# Patient Record
Sex: Male | Born: 1995
Health system: Southern US, Community
[De-identification: ages and names within clinical notes are randomized; demographics above are authoritative.]

## PROBLEM LIST (undated history)

## (undated) DIAGNOSIS — G252 Other specified forms of tremor: Secondary | ICD-10-CM

## (undated) DIAGNOSIS — M25569 Pain in unspecified knee: Secondary | ICD-10-CM

## (undated) DIAGNOSIS — R1013 Epigastric pain: Secondary | ICD-10-CM

## (undated) DIAGNOSIS — H9193 Unspecified hearing loss, bilateral: Secondary | ICD-10-CM

## (undated) HISTORY — PX: TESTICLE SURGERY: SHX794

## (undated) HISTORY — DX: Other specified forms of tremor: G25.2

## (undated) HISTORY — DX: Unspecified hearing loss, bilateral: H91.93

## (undated) HISTORY — DX: Epigastric pain: R10.13

## (undated) HISTORY — DX: Pain in unspecified knee: M25.569

---

## 2018-04-15 DIAGNOSIS — F321 Major depressive disorder, single episode, moderate: Secondary | ICD-10-CM | POA: Diagnosis not present

## 2018-04-22 DIAGNOSIS — F321 Major depressive disorder, single episode, moderate: Secondary | ICD-10-CM | POA: Diagnosis not present

## 2018-04-29 DIAGNOSIS — F321 Major depressive disorder, single episode, moderate: Secondary | ICD-10-CM | POA: Diagnosis not present

## 2018-05-20 DIAGNOSIS — F321 Major depressive disorder, single episode, moderate: Secondary | ICD-10-CM | POA: Diagnosis not present

## 2018-06-10 DIAGNOSIS — Z0000 Encounter for general adult medical examination without abnormal findings: Secondary | ICD-10-CM | POA: Diagnosis not present

## 2018-07-06 DIAGNOSIS — H9193 Unspecified hearing loss, bilateral: Secondary | ICD-10-CM | POA: Diagnosis not present

## 2018-07-06 DIAGNOSIS — G252 Other specified forms of tremor: Secondary | ICD-10-CM | POA: Diagnosis not present

## 2018-07-06 DIAGNOSIS — M25561 Pain in right knee: Secondary | ICD-10-CM | POA: Diagnosis not present

## 2018-07-06 DIAGNOSIS — R1013 Epigastric pain: Secondary | ICD-10-CM | POA: Diagnosis not present

## 2018-07-22 DIAGNOSIS — F321 Major depressive disorder, single episode, moderate: Secondary | ICD-10-CM | POA: Diagnosis not present

## 2018-08-19 DIAGNOSIS — F321 Major depressive disorder, single episode, moderate: Secondary | ICD-10-CM | POA: Diagnosis not present

## 2018-08-31 ENCOUNTER — Encounter: Payer: Self-pay | Admitting: Neurology

## 2018-08-31 ENCOUNTER — Ambulatory Visit (INDEPENDENT_AMBULATORY_CARE_PROVIDER_SITE_OTHER): Payer: Medicare Other | Admitting: Neurology

## 2018-08-31 VITALS — BP 139/89 | HR 84 | Ht 74.0 in | Wt 167.0 lb

## 2018-08-31 DIAGNOSIS — R251 Tremor, unspecified: Secondary | ICD-10-CM

## 2018-08-31 DIAGNOSIS — Z820 Family history of epilepsy and other diseases of the nervous system: Secondary | ICD-10-CM

## 2018-08-31 NOTE — Progress Notes (Signed)
Subjective:    Patient ID: Joel Duncan is a 22 y.o. male.  HPI     Huston Foley, MD, PhD [facility] [address] [address] [place], [place] [address]  Dear Dr. Nedra Hai,   I saw your patient, Joel Duncan, upon your kind request in my neurologic clinic today for initial consultation of his tremor. The patient is accompanied by his father today. As you know, Joel Duncan is a 22 year old right-handed Hispanic gentleman (originally from [place]), with an underlying medical history of hearing loss, right knee pain, and otherwise benign medical history, who reports a long-standing history of hand tremors. He had hand tremors since he was in kindergarten according to the father. He has had hearing loss since childhood, received hearing aids in sixth grade. He has a maternal family history of hearing loss. He has a family history of hand tremors affecting his father, his paternal great uncle, 2 paternal uncles and paternal grandmother. Other than his grandmother, all affected family members have been males and primarily affects the right hand. His tremor versus started in the right hand and in the past years he has noticed it on the left hand. I reviewed your office note from 07/06/2018, as well as 12/30/2016. He has previously seen a neurologist at [facility] and also at [facility] in the past. Prior neurology records were reviewed in Epic.  He had Brain MRI studies in the past.  He had a brain MRI wo contrast at [facility], on 11/04/11, which I reviewed: 1. No findings to suggest the etiology of patient's tremor. There are scattered, punctate T2 hyperintense white matter lesions which are abnormal but nonspecific. These may represent sequela of prior trauma, inflammation, demyelination, or other  vasculopathies but can also be incidental. 2. Posterior fossa arachnoid cyst. He also had prior MRI brain w wo contrast at [facility] in 04/30/06 which I reviewed: IMPRESSION: Posterior fossa  arachnoid cyst, otherwise unremarkable MRI of the brain using the cranial nerve VIII protocol. He has been tried on Mysoline in the past, but he did not help. Propranolol caused worsening tremor he felt. Both meds were sedating.  He had genetic testing in the past including SCA 3 which was negative, fragile X testing was considered but was never done. He does not have a history of developmental delays according to the father. He did require speech therapy because of his hearing loss. He went through [employer] for 1 year but gave up after he felt it was too much stress. He currently works at [employer] and is hoping to become a Production designer, theatre/television/film. DBS was discussed when he was at [facility] several years ago but he was too young for it. He has a sister with no tremor. Father has sisters with no tremors. He had recent blood work through your office on 06/10/2018. I reviewed the results. Total cholesterol was 204, LDL 123. CBC with differential was unremarkable, CMP unremarkable, BUN 11, creatinine 0.84.  His Past Medical History Is Significant For: Past Medical History:  Diagnosis Date  . Bilateral hearing loss   . Dyspepsia   . Intention tremor   . Knee pain    His Past Surgical History Is Significant For:  His Family History Is Significant For: Family History  Problem Relation Age of Onset  . Arthritis Father   . Hypertension Maternal Grandmother   . Hypertension Paternal Grandmother     His Social History Is Significant For: Social History   Socioeconomic History  .  Marital status: Single    Spouse name: Not on file  . Number of children: Not on file  . Years of education: Not on file  . Highest education level: Not on file  Occupational History  . Not on file  Social Needs  . Financial resource strain: Not on file  . Food insecurity:    Worry: Not on file    Inability: Not on file  . Transportation needs:    Medical: Not on file    Non-medical: Not on file   Tobacco Use  . Smoking status: Never Smoker  . Smokeless tobacco: Never Used  Substance and Sexual Activity  . Alcohol use: Never    Frequency: Never  . Drug use: Never  . Sexual activity: Not on file  Lifestyle  . Physical activity:    Days per week: Not on file    Minutes per session: Not on file  . Stress: Not on file  Relationships  . Social connections:    Talks on phone: Not on file    Gets together: Not on file    Attends religious service: Not on file    Active member of club or organization: Not on file    Attends meetings of clubs or organizations: Not on file    Relationship status: Not on file  Other Topics Concern  . Not on file  Social History Narrative  . Not on file    His Allergies Are:  No Known Allergies:   His Current Medications Are:  No outpatient encounter medications on file as of 08/31/2018.   No facility-administered encounter medications on file as of 08/31/2018.   : Review of Systems:  Out of a complete 14 point review of systems, all are reviewed and negative with the exception of these symptoms as listed below:  Review of Systems  Neurological:       Pt presents today to discuss his tremor. Pt is right handed and notices the tremor primarily in his right hand but sometimes in his left hand. Pt has tried propranolol in the past but it did not help.    Objective:  Neurological Exam  Physical Exam Physical Examination:   Vitals:   08/31/18 0956  BP: 139/89  Pulse: 84   General Examination: The patient is a very pleasant 22 y.o. male in no acute distress. He appears well-developed and well-nourished and well groomed.   HEENT: Normocephalic, atraumatic, pupils are equal, round and reactive to light and accommodation. Narrow set eyes, extraocular tracking is good without limitation to gaze excursion or nystagmus noted. Normal smooth pursuit is noted. Hearing is mildly impaired, has b/l Hearing aids. Face is symmetric with normal facial  animation and normal facial sensation. Speech is clear for the most, part, mild speech impediment. There is no hypophonia. There is no lip, neck/head, jaw or voice tremor. Neck is supple with full range of passive and active motion. There are no carotid bruits on auscultation. Oropharynx exam reveals: mild mouth dryness, adequate teeth hygiene. Tongue protrudes centrally and palate elevates symmetrically.    Chest: Clear to auscultation without wheezing, rhonchi or crackles noted.  Heart: S1+S2+0, regular and normal without murmurs, rubs or gallops noted.   Abdomen: Soft, non-tender and non-distended with normal bowel sounds appreciated on auscultation.  Extremities: There is no pitting edema in the distal lower extremities bilaterally. Pedal pulses are intact.  Skin: Warm and dry without trophic changes noted.  Musculoskeletal: exam reveals no obvious joint deformities, tenderness or joint  swelling or erythema. Long appearing arms.   Neurologically:  Mental status: The patient is awake, alert and oriented in all 4 spheres. His immediate and remote memory, attention, language skills and fund of knowledge are appropriate. There is no evidence of aphasia, agnosia, apraxia or anomia. Speech is clear with normal prosody and enunciation. Thought process is linear. Mood is normal and affect is normal.  Cranial nerves II - XII are as described above under HEENT exam. In addition: shoulder shrug is normal with equal shoulder height noted. Motor exam: Normal bulk, strength and tone is noted. There is no drift, or rebound. Romberg is negative. Reflexes are 2+ throughout. Fine motor skills and coordination: intact with normal finger taps, normal hand movements, normal rapid alternating patting, normal foot taps and normal foot agility.  Cerebellar testing: No dysmetria or intention tremor on finger to nose testing. Heel to shin is unremarkable bilaterally. There is no truncal or gait ataxia.   On  08/31/2018: on Archimedes spiral drawing he has a coarse and moderate degree of tremor with the right in the left hand. His handwriting is tremulous, legible, not micrographic.  He has a moderate b/l UE postural and action tremor, no significant intention tremor. He does have mild and intermittent resting tremor component. Sensory exam: intact to light touch, pinprick, vibration, temperature senseproprioception in the upper and lower extremities.  Gait, station and balance: He stands easily. No veering to one side is noted. No leaning to one side is noted. Posture is age-appropriate and stance is narrow based. Gait shows normal stride length and normal pace, but decrease in arm swing b/l.                Assessment and Plan:   In summary, Joel Duncan is a very pleasant 22 y.o.-year old male with an underlying medical history of hearing loss, right knee pain, and otherwise benign medical history, who presents for evaluation of his Long-standing history of hand tremors with a positive family history on his father's side of right-sided hand tremor primarily. On examination, he does have a bilateral postural and action tremorin the moderate degree with a slight resting component. His history and examination is unusual in that he is rather young. I'm not sure that this is classic essential tremor but for now, he does not have any other diagnosis. He does not have any telltale dystonia or parkinsonism. Nevertheless, he does have reduced arm swing bilaterally. He may be a candidate for DBS, we talked about this to some degree today. Unfortunately, he did not respond to typical symptomatic treatment in the form of beta blocker or Mysoline. Again, this makes essential tremor rather questionable in my mind. I would like to proceed with a DaT scan, which may help diagnostically. I explained the scan to him and his father in detail today. Written informed consent was obtained today. I wonder if he would respond to  low-dose Sinemet. I would like to proceed with the scan first however. We may consider referral to [facility] for DBS evaluation. He is reluctant, which I completely understand as he is so young and father's also worried about the risks associated with DBS. Nevertheless, from the medication standpoint I really don't have any other suggestions quite yet. I would like to see him back soon after his nuclear medicine scan. He does note exacerbation with nervousness and anxiety. We talked about tremor triggers as well today.  I answered all their questions today and the patient and his father  were in agreement with the above outlined plan. Most of my 60 minute visit today was spent in counseling and coordination of care, reviewing test results and reviewing medication.  Thank you very much for allowing me to participate in the care of this nice patient. If I can be of any further assistance to you please do not hesitate to call me at [phone].  Sincerely,   Huston Foley, MD, PhD

## 2018-08-31 NOTE — Patient Instructions (Addendum)
You may have a form of essential tremor, you do have a family history of it in your father and father's side of the family.   You have had brain MRI testing before, which did not show any significant abnormality.  As discussed, we will do a DaT scan: This is a specialized brain scan designed to help with diagnosis of tremor disorders. A radioactive marker gets injected and the uptake is measured in the brain and compared to normal controls and right side is compared to the left, a change in uptake can help with diagnosis of certain tremor disorders. A brain MRI on the other hand is a brain scan that helps look at the brain structure in more detail overall and look for age-related changes, blood vessel related changes and look for stroke and volume loss which we call atrophy.   Also, you may be a candidate for DBS: I may make a referral to Dr. Rubin Payor at [facility] for evaluation for deep brain stimulation (DBS) for treatment of advanced essential tremor.  We will see you back in 3 months.

## 2018-09-30 DIAGNOSIS — F321 Major depressive disorder, single episode, moderate: Secondary | ICD-10-CM | POA: Diagnosis not present

## 2018-10-08 ENCOUNTER — Encounter (HOSPITAL_COMMUNITY): Payer: Medicare Other

## 2018-10-08 ENCOUNTER — Ambulatory Visit (HOSPITAL_COMMUNITY): Admission: RE | Admit: 2018-10-08 | Payer: Medicare Other | Source: Ambulatory Visit

## 2018-10-08 ENCOUNTER — Telehealth: Payer: Self-pay

## 2018-10-08 NOTE — Telephone Encounter (Signed)
Received a call from [place] at [facility]. Pt did not show for his DAT scan appt today after numerous reminder calls.  I called pt to discuss. No answer, left a message asking him to call me back.

## 2018-10-08 NOTE — Telephone Encounter (Signed)
Spoke to patient's father and and relayed to him that he has to call [facility]  And reschedule  [phone] . I relayed to patient's father that it's  very important that he have this scan he relayed he understood .

## 2018-10-12 NOTE — Telephone Encounter (Signed)
Patient is scheduled for DAT Scan.

## 2018-10-21 DIAGNOSIS — F321 Major depressive disorder, single episode, moderate: Secondary | ICD-10-CM | POA: Diagnosis not present

## 2018-11-19 ENCOUNTER — Other Ambulatory Visit (HOSPITAL_COMMUNITY): Payer: Medicare Other

## 2018-11-19 ENCOUNTER — Ambulatory Visit (HOSPITAL_COMMUNITY): Payer: Medicare Other

## 2018-11-25 ENCOUNTER — Ambulatory Visit (HOSPITAL_COMMUNITY)
Admission: RE | Admit: 2018-11-25 | Discharge: 2018-11-25 | Disposition: A | Discharge status: Home / Self Care | Payer: Medicare Other | Source: Ambulatory Visit | Attending: Neurology | Admitting: Neurology

## 2018-11-25 DIAGNOSIS — Z820 Family history of epilepsy and other diseases of the nervous system: Secondary | ICD-10-CM | POA: Insufficient documentation

## 2018-11-25 DIAGNOSIS — R251 Tremor, unspecified: Secondary | ICD-10-CM

## 2018-11-25 MED ORDER — IODINE STRONG (LUGOLS) 5 % PO SOLN
0.8000 mL | Freq: Once | ORAL | Status: AC
  Administered 2018-11-25: 0.8 mL via ORAL
  Filled 2018-11-25: qty 0.8

## 2018-11-25 NOTE — Progress Notes (Signed)
Please call pt regarding the recent DaT scan: the radioactive marker uptake pattern was in keeping with a fairly normal result and not in favor or an underlying parkinsonism or parkinsonian d/o. FU later this month as scheduled.   Huston Foley, MD, PhD [facility])

## 2018-11-26 ENCOUNTER — Telehealth: Payer: Self-pay

## 2018-11-26 NOTE — Telephone Encounter (Signed)
I called pt. I advised him of his DAT scan results. I reminded pt of his appt on 12/14/18. Pt verbalized understanding of results and of appt date and time. Pt had no questions at this time but was encouraged to call back if questions arise.

## 2018-11-26 NOTE — Telephone Encounter (Signed)
-----   Message from Huston Foley, MD sent at 11/25/2018  4:30 PM EST ----- Please call pt regarding the recent DaT scan: the radioactive marker uptake pattern was in keeping with a fairly normal result and not in favor or an underlying parkinsonism or parkinsonian d/o. FU later this month as scheduled.   Huston Foley, MD, PhD [facility])

## 2018-12-14 ENCOUNTER — Encounter: Payer: Self-pay | Admitting: Neurology

## 2018-12-14 ENCOUNTER — Ambulatory Visit (INDEPENDENT_AMBULATORY_CARE_PROVIDER_SITE_OTHER): Payer: Medicare Other | Admitting: Neurology

## 2018-12-14 VITALS — BP 128/78 | HR 85 | Ht 74.0 in | Wt 174.0 lb

## 2018-12-14 DIAGNOSIS — R251 Tremor, unspecified: Secondary | ICD-10-CM | POA: Diagnosis not present

## 2018-12-14 MED ORDER — PROPRANOLOL HCL 20 MG PO TABS: ORAL_TABLET | ORAL | 5 refills | Status: AC

## 2018-12-14 NOTE — Progress Notes (Signed)
Subjective:    Patient ID: Joel Duncan is a 23 y.o. male.  HPI     Interim history:   Joel Duncan is a 23 year old right-handed Hispanic gentleman (originally from [place]), with an underlying medical history of hearing loss, right knee pain, and otherwise benign medical history, who presents for follow-up consultation of his hand tremors. The patient is accompanied by his dad again today. I first met him on 08/31/2018 at which time he reported a long-standing history of hand tremors and a strong family history of hand tremors but also a family history of hearing loss. He reported side effects with previously tried medications including propranolol and Mysoline. I asked him to proceed with a DaT scan.   He had a nuclear medicine DaT scan on 11/25/2018 and I reviewed the results: IMPRESSION: No convincing evidence of loss of dopamine transport populations to suggest Parkinson's syndrome pathology.  We called him with his test results.  Today, 12/14/2018: He reports feeling stable, the same. As he recalls, he had sleepiness from the Inderal in the past, and also from the Mysoline, one of them or both made him angry. His dad believes, the primidone made him angry. He would be willing to try the propranolol again and he was told in the past that he may be able to tolerate it better when he got older. He did get a prescription for the propranolol in March 2018 when he saw a neurologist in [place]. They report that the doctor moved away and I'm not sure if he actually took it.   Previously:  08/31/2018: (He) reports a long-standing history of hand tremors. He had hand tremors since he was in kindergarten according to the father. He has had hearing loss since childhood, received hearing aids in sixth grade. He has a maternal family history of hearing loss. He has a family history of hand tremors affecting his father, his paternal great uncle, 2 paternal uncles and paternal grandmother. Other than  his grandmother, all affected family members have been males and primarily affects the right hand. His tremor versus started in the right hand and in the past years he has noticed it on the left hand. I reviewed your office note from 07/06/2018, as well as 12/30/2016. He has previously seen a neurologist at [facility] and also at [facility] in the past. Prior neurology records were reviewed in [place].  He had Brain MRI studies in the past.  He had a brain MRI wo contrast at [facility], on 11/04/11, which I reviewed: 1. No findings to suggest the etiology of patient's tremor. There are scattered, punctate T2 hyperintense white matter lesions which are abnormal but nonspecific. These may represent sequela of prior trauma, inflammation, demyelination, or other  vasculopathies but can also be incidental. 2. Posterior fossa arachnoid cyst. He also had prior MRI brain w wo contrast at [facility] in 04/30/06 which I reviewed: IMPRESSION: Posterior fossa arachnoid cyst, otherwise unremarkable MRI of the brain using the cranial nerve VIII protocol. He has been tried on Mysoline in the past, but he did not help. Propranolol caused worsening tremor he felt. Both meds were sedating.  He had genetic testing in the past including SCA 3 which was negative, fragile X testing was considered but was never done. He does not have a history of developmental delays according to the father. He did require speech therapy because of his hearing loss. He went through [employer] college for 1 year but gave up after he felt it was too much  stress. 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 DBS was discussed when he was at [facility] several years ago but he was too young for it. He has a sister with no tremor. Father has sisters with no tremors. He had recent blood work through your office on 06/10/2018. I reviewed the results. Total cholesterol was 204, LDL 123. CBC with differential was unremarkable, CMP unremarkable,  BUN 11, creatinine 0.84. The patient's allergies, current medications, family history, past medical history, past social history, past surgical history and problem list were reviewed and updated as appropriate.   His Past Medical History Is Significant For: Past Medical History:  Diagnosis Date  . Bilateral hearing loss   . Dyspepsia   . Intention tremor   . Knee pain     His Past Surgical History Is Significant For:   His Family History Is Significant For: Family History  Problem Relation Age of Onset  . Arthritis Father   . Hypertension Maternal Grandmother   . Hypertension Paternal Grandmother     His Social History Is Significant For: Social History   Socioeconomic History  . Marital status: Single    Spouse name: Not on file  . Number of children: Not on file  . Years of education: Not on file  . Highest education level: Not on file  Occupational History  . Not on file  Social Needs  . Financial resource strain: Not on file  . Food insecurity:    Worry: Not on file    Inability: Not on file  . Transportation needs:    Medical: Not on file    Non-medical: Not on file  Tobacco Use  . Smoking status: Never Smoker  . Smokeless tobacco: Never Used  Substance and Sexual Activity  . Alcohol use: Never    Frequency: Never  . Drug use: Never  . Sexual activity: Not on file  Lifestyle  . Physical activity:    Days per week: Not on file    Minutes per session: Not on file  . Stress: Not on file  Relationships  . Social connections:    Talks on phone: Not on file    Gets together: Not on file    Attends religious service: Not on file    Active member of club or organization: Not on file    Attends meetings of clubs or organizations: Not on file    Relationship status: Not on file  Other Topics Concern  . Not on file  Social History Narrative  . Not on file    His Allergies Are:  No Known Allergies:   His Current Medications Are:  No outpatient  encounter medications on file as of 12/14/2018.   No facility-administered encounter medications on file as of 12/14/2018.   :  Review of Systems:  Out of a complete 14 point review of systems, all are reviewed and negative with the exception of these symptoms as listed below:  Review of Systems  Neurological:       Pt presents today to discuss his tremors and DAT scan results. Pt reports that his tremors are stable.    Objective:  Neurological Exam  Physical Exam Physical Examination:   Vitals:   12/14/18 0932  BP: 128/78  Pulse: 85    General Examination: The patient is a very pleasant 23 y.o. male in no acute distress. He appears well-developed and well-nourished and well groomed.   HEENT: Normocephalic, atraumatic, pupils are  equal, round and reactive to light and accommodation. Narrow set eyes, extraocular tracking is good without limitation to gaze excursion or nystagmus noted. Normal smooth pursuit is noted. Hearing is mildly impaired, has b/l hearing aids in place, all stable. Face is symmetric with normal facial animation and normal facial sensation. Speech is clear for the most, part, mild speech impediment, stable, no hypophonia. There is no lip, neck/head, jaw or voice tremor. Neck is supple with full range of passive and active motion. There are no carotid bruits on auscultation. Oropharynx exam reveals: mild mouth dryness, adequate teeth hygiene. Tongue protrudes centrally and palate elevates symmetrically.    Chest: Clear to auscultation without wheezing, rhonchi or crackles noted.  Heart: S1+S2+0, regular and normal without murmurs, rubs or gallops noted.   Abdomen: Soft, non-tender and non-distended with normal bowel sounds appreciated on auscultation.  Extremities: There is no pitting edema in the distal lower extremities bilaterally.   Skin: Warm and dry without trophic changes noted.  Musculoskeletal: exam reveals no obvious joint deformities, tenderness  or joint swelling or erythema. Long appearing arms.   Neurologically:  Mental status: The patient is awake, alert and oriented in all 4 spheres. His immediate and remote memory, attention, language skills and fund of knowledge are appropriate. There is no evidence of aphasia, agnosia, apraxia or anomia. Speech is clear with normal prosody and enunciation. Thought process is linear. Mood is normal and affect is normal.  Cranial nerves II - XII are as described above under HEENT exam.  Motor exam: Normal bulk, strength and tone is noted. There is no drift, or rebound, no dystonic posturing, Romberg is negative. Reflexes are 2+ throughout. Fine motor skills and coordination: intact with normal finger taps, normal hand movements, normal rapid alternating patting, normal foot taps and normal foot agility. Maybe slight difficulty with her right hand. Right ring finger does not extend as well. Cerebellar testing: No dysmetria or intention tremor on finger to nose testing. Heel to shin is unremarkable bilaterally. There is no truncal or gait ataxia.   (On 08/31/2018: on Archimedes spiral drawing he has a coarse and moderate degree of tremor with the right in the left hand. His handwriting is tremulous, legible, not micrographic.)  He has a moderate b/l UE postural and action tremor, R>L, no significant intention tremor. He does have mild and intermittent resting tremor component. Sensory exam: intact to light touch.  Gait, station and balance: He stands easily. No veering to one side is noted. No leaning to one side is noted. Posture is age-appropriate and stance is narrow based. Gait shows normal stride length and normal pace, but slight decrease in arm swing b/l.                Assessment and Plan:   In summary, Joel Duncan is a very pleasant 24 year old male with an underlying medical history of hearing loss, right knee pain, and otherwise benign medical history, who presents for evaluation of his  long-standing history of hand tremors, R>L with a positive family history on his father's side of right-sided hand tremor primarily. On examination, he does have a bilateral postural and action tremor in the moderate degree with a slight resting component. His tremor is worse on the right. He has stable findings today, recent nuclear medicine DaT scan was normal, arguing against a parkinsonian syndrome and therefore I am reluctant to try levodopa for symptomatically treatment.  His history and examination is unusual in that he is rather young.  I'm not sure that this is classic essential tremor but for now, he does not fit any other diagnosis. He does not have any telltale dystonia or parkinsonism. He did not respond to typical symptomatic treatment in the form of beta blocker or Mysoline. Again, this makes essential tremor rather questionable, but he may have had side effects with propranolol because of young age. We mutually agreed to try propranolol in low dose again. We will start with 20 mg at bedtime and gradually built up to 20 mg 3 times a day. I gave him written instructions and a new prescription. He does note exacerbation with nervousness and anxiety. We talked about tremor triggers as well today. I would like to see him back in 3 months, sooner if needed. I answered all their questions today and the patient and his father were in agreement. I spent 20 minutes in total face-to-face time with the patient, more than 50% of which was spent in counseling and coordination of care, reviewing test results, reviewing medication and discussing or reviewing the diagnosis of tremor, prognosis and treatment options. Pertinent laboratory and imaging test results that were available during this visit with the patient were reviewed by me and considered in my medical decision making (see chart for details).

## 2018-12-14 NOTE — Patient Instructions (Signed)
Your exam is stable.  Let's try the Inderal (generic name: propranolol) again, 20 mg strength: take 1 pill each bedtime for 1 week, then 1 pill twice daily for 1 week, then 1 pill 3 times a day thereafter. Common side effect reported are: lethargy, sedation, low blood pressure and low pulse rate. Please monitor your BP and Pulse every few days, if your pulse drops lower than 55 you may feel bad and we may have to adjust your dose. Same with your BP below 110/55.

## 2018-12-26 DIAGNOSIS — J069 Acute upper respiratory infection, unspecified: Secondary | ICD-10-CM | POA: Diagnosis not present

## 2018-12-26 DIAGNOSIS — R05 Cough: Secondary | ICD-10-CM | POA: Diagnosis not present

## 2019-01-13 DIAGNOSIS — F321 Major depressive disorder, single episode, moderate: Secondary | ICD-10-CM | POA: Diagnosis not present

## 2019-01-19 ENCOUNTER — Encounter: Payer: Self-pay | Admitting: Neurology

## 2019-03-15 ENCOUNTER — Ambulatory Visit: Payer: Medicare Other | Admitting: Neurology

## 2019-04-22 DIAGNOSIS — Z20828 Contact with and (suspected) exposure to other viral communicable diseases: Secondary | ICD-10-CM | POA: Diagnosis not present

## 2019-06-10 DIAGNOSIS — F4323 Adjustment disorder with mixed anxiety and depressed mood: Secondary | ICD-10-CM | POA: Diagnosis not present

## 2019-07-15 DIAGNOSIS — Z1331 Encounter for screening for depression: Secondary | ICD-10-CM | POA: Diagnosis not present

## 2019-07-15 DIAGNOSIS — Z6821 Body mass index (BMI) 21.0-21.9, adult: Secondary | ICD-10-CM | POA: Diagnosis not present

## 2019-07-15 DIAGNOSIS — Z0000 Encounter for general adult medical examination without abnormal findings: Secondary | ICD-10-CM | POA: Diagnosis not present

## 2019-07-29 DIAGNOSIS — G252 Other specified forms of tremor: Secondary | ICD-10-CM | POA: Diagnosis not present

## 2019-07-29 DIAGNOSIS — R7989 Other specified abnormal findings of blood chemistry: Secondary | ICD-10-CM | POA: Diagnosis not present

## 2019-07-29 DIAGNOSIS — H9193 Unspecified hearing loss, bilateral: Secondary | ICD-10-CM | POA: Diagnosis not present

## 2019-07-29 DIAGNOSIS — R945 Abnormal results of liver function studies: Secondary | ICD-10-CM | POA: Diagnosis not present

## 2019-07-29 DIAGNOSIS — M25561 Pain in right knee: Secondary | ICD-10-CM | POA: Diagnosis not present

## 2019-08-26 DIAGNOSIS — F4323 Adjustment disorder with mixed anxiety and depressed mood: Secondary | ICD-10-CM | POA: Diagnosis not present

## 2019-09-08 DIAGNOSIS — Z135 Encounter for screening for eye and ear disorders: Secondary | ICD-10-CM | POA: Diagnosis not present

## 2019-09-08 DIAGNOSIS — Z9181 History of falling: Secondary | ICD-10-CM | POA: Diagnosis not present

## 2019-09-08 DIAGNOSIS — Z0000 Encounter for general adult medical examination without abnormal findings: Secondary | ICD-10-CM | POA: Diagnosis not present

## 2019-09-08 DIAGNOSIS — Z1331 Encounter for screening for depression: Secondary | ICD-10-CM | POA: Diagnosis not present

## 2019-09-29 DIAGNOSIS — F4323 Adjustment disorder with mixed anxiety and depressed mood: Secondary | ICD-10-CM | POA: Diagnosis not present

## 2019-10-11 IMAGING — NM NM DATSCAN
5 series · 33 of 40 positions shown · non-contrast
Comparison: None.

CLINICAL DATA: 22-year-old male with RIGHT hand tremors since
childhood. New initiation of LEFT hand tremors.

EXAM:
NUCLEAR MEDICINE BRAIN IMAGING WITH SPECT  (DaTscan )
TECHNIQUE: SPECT images of the brain were obtained after intravenous injection
of radiopharmaceutical. 4 hour post injection imaging. Appropriate
positioning. 0.8 ml lugols solution administered in a.m
RADIOPHARMACEUTICALS:  4.1 millicuries I 123 Ioflupane

[Series 1: spect - (id) _(id)_tra · 4.1mm · 4.14mm/px · 5 of 128 frames shown]
[frame 11/128]
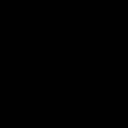
[frame 32/128]
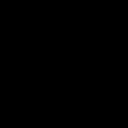
[frame 75/128]
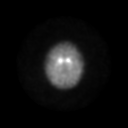
[frame 96/128]
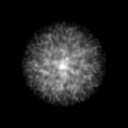
[frame 118/128]
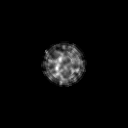

[Series 1: spect - (id) _(id)_cor · 4.1mm · 4.14mm/px · 5 of 128 frames shown]
[frame 11/128]
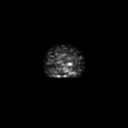
[frame 32/128]
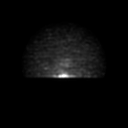
[frame 75/128]
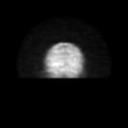
[frame 96/128]
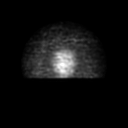
[frame 118/128]
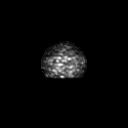

[Series 1: brain spect · 4.14mm/px · 5 of 120 frames shown]
[frame 11/120  full-range]
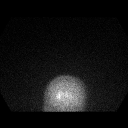
[frame 31/120  full-range]
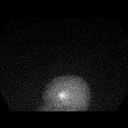
[frame 71/120  full-range]
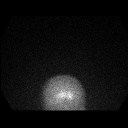
[frame 91/120  full-range]
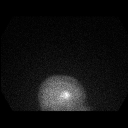
[frame 111/120  full-range]
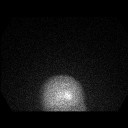

[Series 1016: mpr tra (id) range · 0.58mm/px · 8 of 37 slices shown]
[im 1/37]
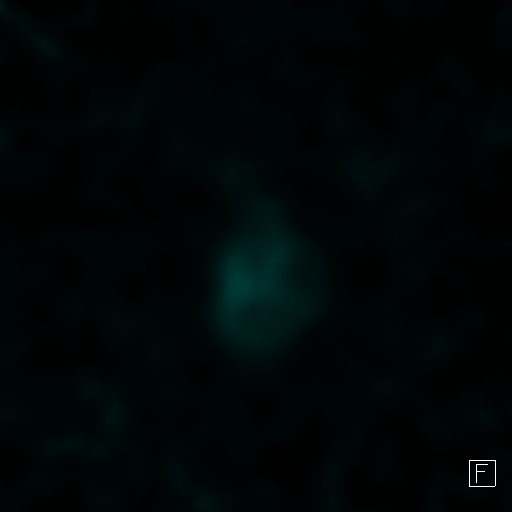
[im 9/37]
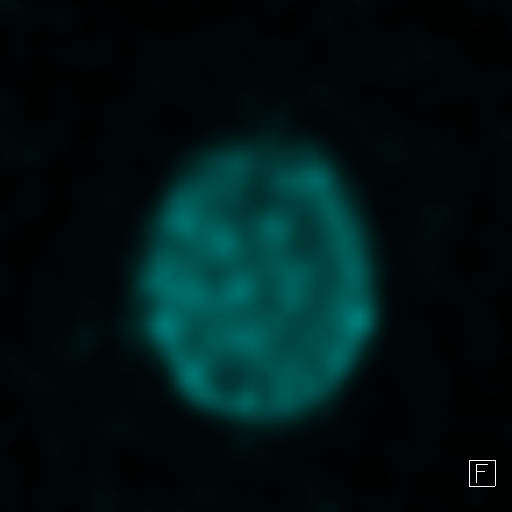
[im 13/37]
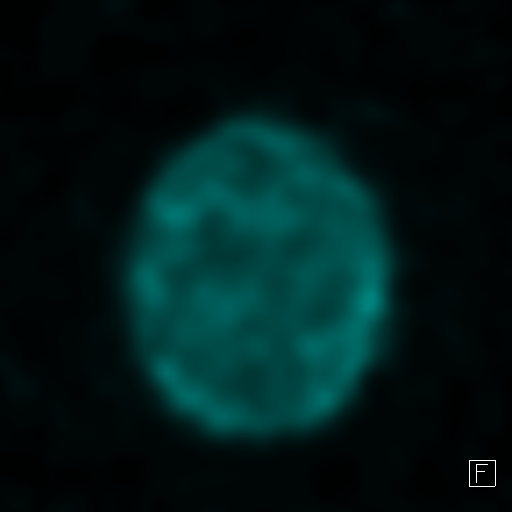
[im 17/37]
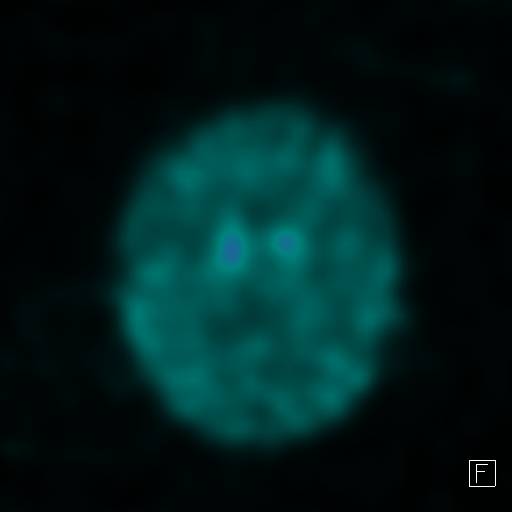
[im 21/37]
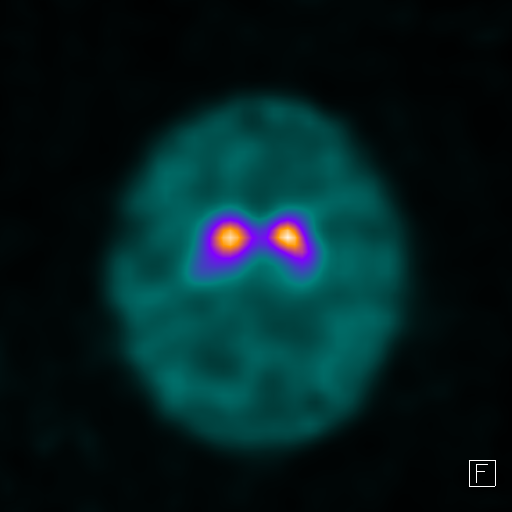
[im 25/37]
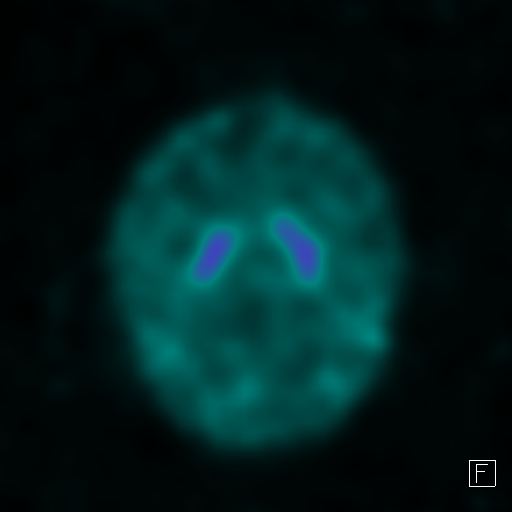
[im 33/37]
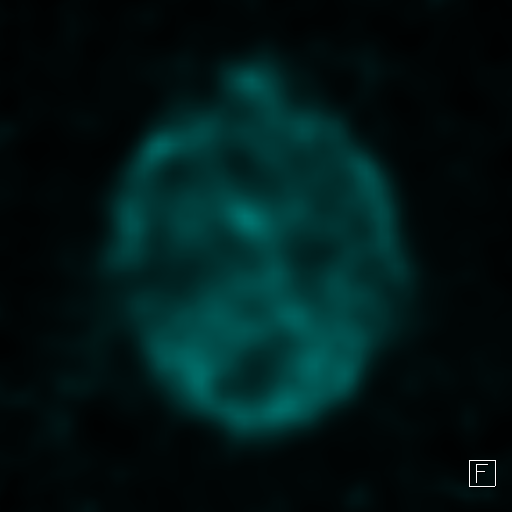
[im 37/37]
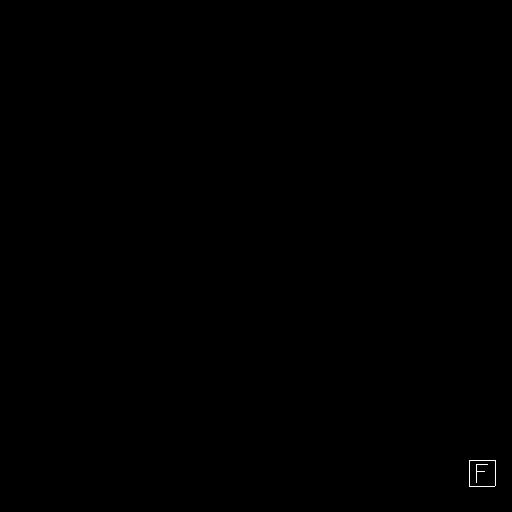

[Series 1032: mpr (id) range · 1.01mm/px · 10 of 43 slices shown]
[im 1/43]
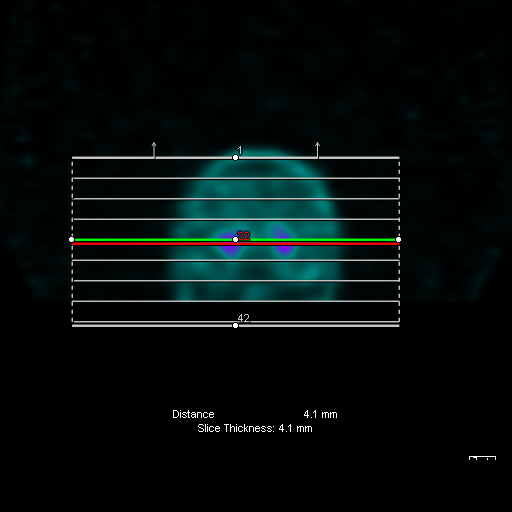
[im 4/43]
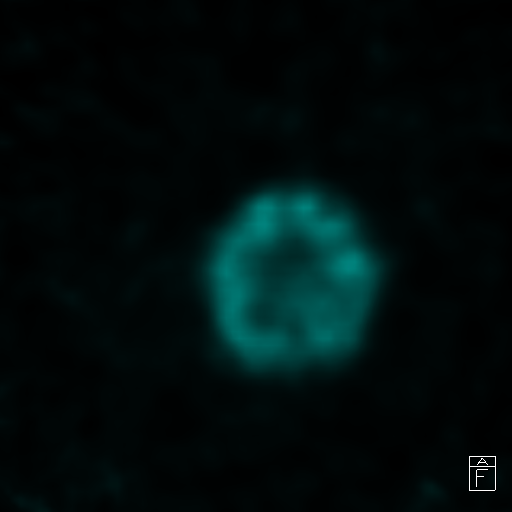
[im 8/43]
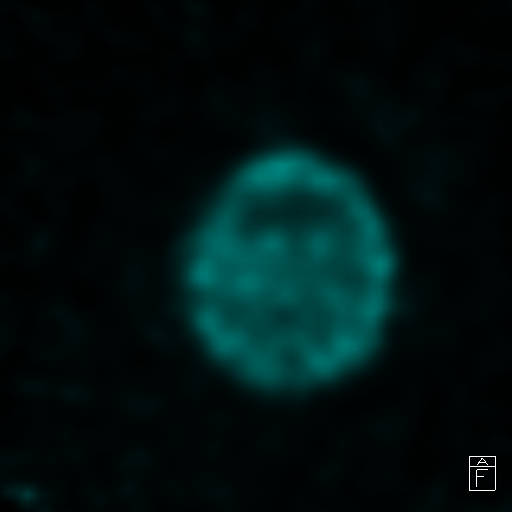
[im 16/43]
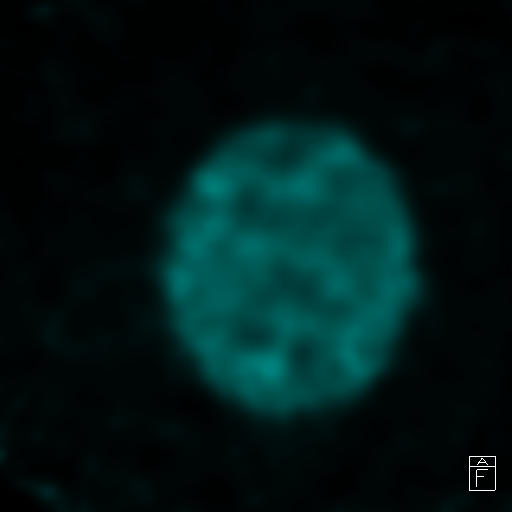
[im 20/43]
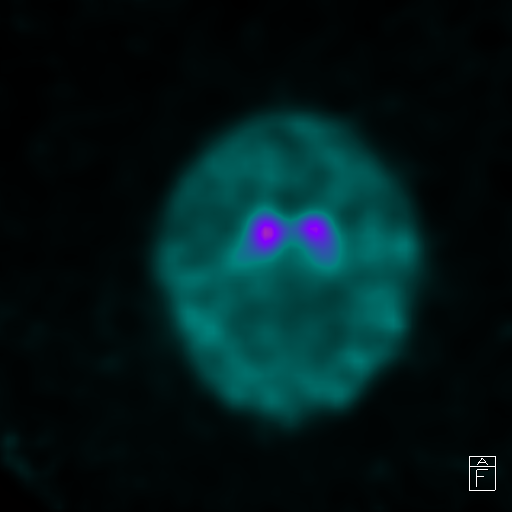
[im 23/43]
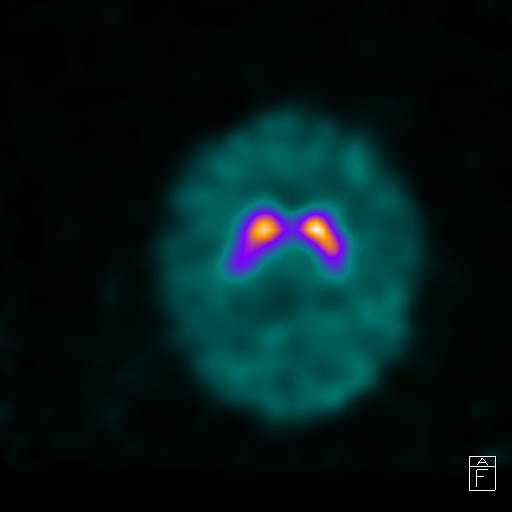
[im 27/43]
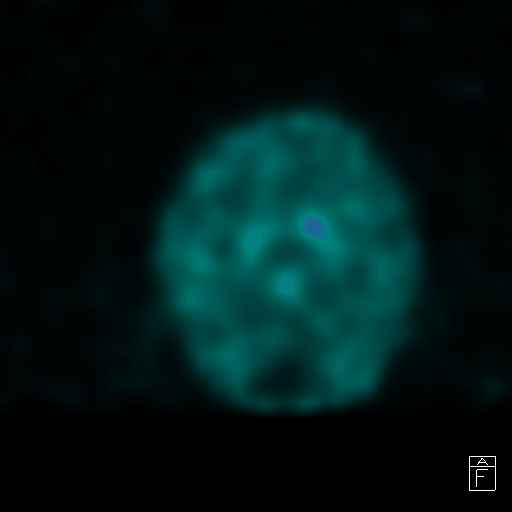
[im 31/43]
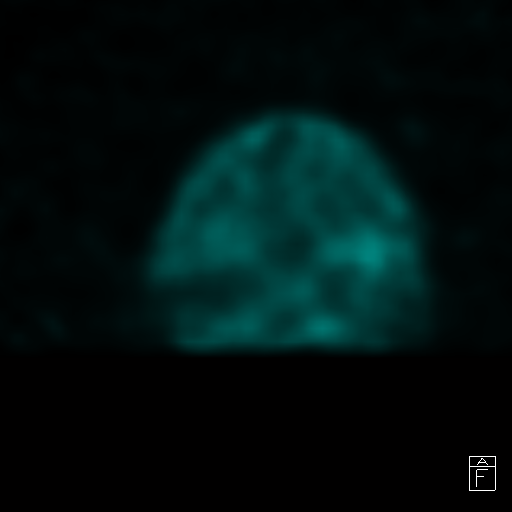
[im 39/43]
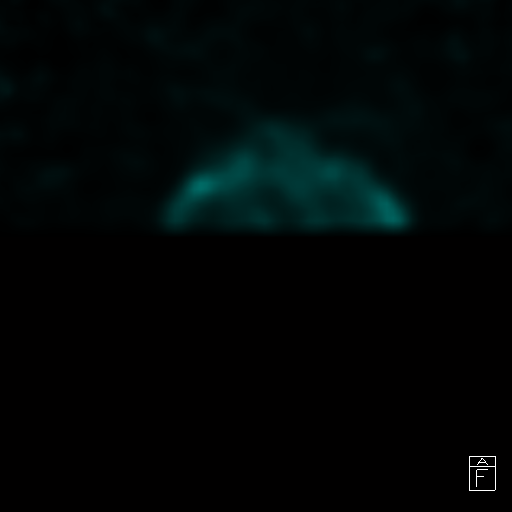
[im 43/43]
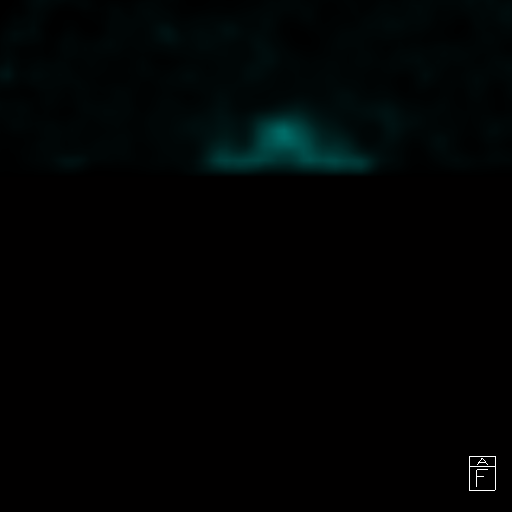

[33 of 40 positions shown; findings below may reference images not displayed]

FINDINGS: Symmetric activity in the head of the caudate nuclei. The posterior
striata are asymmetric with the posterior RIGHT striata slightly
thinned and the posterior RIGHT striata slightly truncated; however,
this is felt to be within normal limits for anatomic variation.
IMPRESSION: No convincing evidence of loss of dopamine transport populations to
suggest Parkinson's syndrome pathology.

## 2019-10-27 DIAGNOSIS — F4323 Adjustment disorder with mixed anxiety and depressed mood: Secondary | ICD-10-CM | POA: Diagnosis not present

## 2019-10-28 DIAGNOSIS — E119 Type 2 diabetes mellitus without complications: Secondary | ICD-10-CM | POA: Diagnosis not present

## 2020-02-23 DIAGNOSIS — G252 Other specified forms of tremor: Secondary | ICD-10-CM | POA: Diagnosis not present

## 2020-02-23 DIAGNOSIS — R7989 Other specified abnormal findings of blood chemistry: Secondary | ICD-10-CM | POA: Diagnosis not present

## 2020-02-23 DIAGNOSIS — M25561 Pain in right knee: Secondary | ICD-10-CM | POA: Diagnosis not present

## 2020-02-23 DIAGNOSIS — H9193 Unspecified hearing loss, bilateral: Secondary | ICD-10-CM | POA: Diagnosis not present

## 2021-10-08 DIAGNOSIS — R7989 Other specified abnormal findings of blood chemistry: Secondary | ICD-10-CM | POA: Diagnosis not present

## 2021-10-08 DIAGNOSIS — R1013 Epigastric pain: Secondary | ICD-10-CM | POA: Diagnosis not present

## 2021-10-08 DIAGNOSIS — H9193 Unspecified hearing loss, bilateral: Secondary | ICD-10-CM | POA: Diagnosis not present

## 2021-10-08 DIAGNOSIS — Z6822 Body mass index (BMI) 22.0-22.9, adult: Secondary | ICD-10-CM | POA: Diagnosis not present

## 2021-10-08 DIAGNOSIS — G252 Other specified forms of tremor: Secondary | ICD-10-CM | POA: Diagnosis not present

## 2021-10-08 DIAGNOSIS — M25561 Pain in right knee: Secondary | ICD-10-CM | POA: Diagnosis not present

## 2022-01-07 DIAGNOSIS — G252 Other specified forms of tremor: Secondary | ICD-10-CM | POA: Diagnosis not present

## 2022-01-07 DIAGNOSIS — R1013 Epigastric pain: Secondary | ICD-10-CM | POA: Diagnosis not present

## 2022-01-07 DIAGNOSIS — R7989 Other specified abnormal findings of blood chemistry: Secondary | ICD-10-CM | POA: Diagnosis not present

## 2022-01-07 DIAGNOSIS — M25561 Pain in right knee: Secondary | ICD-10-CM | POA: Diagnosis not present

## 2022-01-07 DIAGNOSIS — Z1331 Encounter for screening for depression: Secondary | ICD-10-CM | POA: Diagnosis not present

## 2022-01-07 DIAGNOSIS — H9193 Unspecified hearing loss, bilateral: Secondary | ICD-10-CM | POA: Diagnosis not present

## 2022-01-07 DIAGNOSIS — Z6823 Body mass index (BMI) 23.0-23.9, adult: Secondary | ICD-10-CM | POA: Diagnosis not present

## 2022-04-08 DIAGNOSIS — R1013 Epigastric pain: Secondary | ICD-10-CM | POA: Diagnosis not present

## 2022-04-08 DIAGNOSIS — G252 Other specified forms of tremor: Secondary | ICD-10-CM | POA: Diagnosis not present

## 2022-04-08 DIAGNOSIS — H9193 Unspecified hearing loss, bilateral: Secondary | ICD-10-CM | POA: Diagnosis not present

## 2022-04-08 DIAGNOSIS — R7989 Other specified abnormal findings of blood chemistry: Secondary | ICD-10-CM | POA: Diagnosis not present

## 2022-04-08 DIAGNOSIS — Z6823 Body mass index (BMI) 23.0-23.9, adult: Secondary | ICD-10-CM | POA: Diagnosis not present

## 2022-04-08 DIAGNOSIS — M25561 Pain in right knee: Secondary | ICD-10-CM | POA: Diagnosis not present

## 2022-09-30 DIAGNOSIS — H9193 Unspecified hearing loss, bilateral: Secondary | ICD-10-CM | POA: Diagnosis not present

## 2022-09-30 DIAGNOSIS — Z6823 Body mass index (BMI) 23.0-23.9, adult: Secondary | ICD-10-CM | POA: Diagnosis not present

## 2022-09-30 DIAGNOSIS — R1084 Generalized abdominal pain: Secondary | ICD-10-CM | POA: Diagnosis not present
